# Patient Record
Sex: Female | Born: 1975 | Race: Black or African American | Hispanic: No | Smoking: Never smoker
Health system: Southern US, Community
[De-identification: ages and names within clinical notes are randomized; demographics above are authoritative.]

---

## 2016-09-27 ENCOUNTER — Encounter: Payer: Self-pay | Admitting: Emergency Medicine

## 2016-09-27 ENCOUNTER — Emergency Department
Admission: EM | Admit: 2016-09-27 | Discharge: 2016-09-27 | Disposition: A | Discharge status: Home / Self Care | Payer: Managed Care, Other (non HMO) | Attending: Emergency Medicine | Admitting: Emergency Medicine

## 2016-09-27 DIAGNOSIS — L258 Unspecified contact dermatitis due to other agents: Secondary | ICD-10-CM | POA: Insufficient documentation

## 2016-09-27 DIAGNOSIS — R21 Rash and other nonspecific skin eruption: Secondary | ICD-10-CM | POA: Diagnosis present

## 2016-09-27 MED ORDER — PREDNISONE 20 MG PO TABS
40.0000 mg | ORAL_TABLET | Freq: Once | ORAL | Status: AC
  Administered 2016-09-27: 40 mg via ORAL
  Filled 2016-09-27: qty 2

## 2016-09-27 MED ORDER — PREDNISONE 20 MG PO TABS: ORAL_TABLET | ORAL | 0 refills | Status: DC

## 2016-09-27 NOTE — ED Notes (Signed)
Upon assessment pt reports having "itchy skin" that began yesterday. Upon assessment pt's skin looks drys and mild irritation visible to nose.

## 2016-09-27 NOTE — ED Triage Notes (Signed)
Pt ambulatory to triage with steady gait, no distress noted. Pt c/o rash on mouth and nose x1 day. Pt took benadryl on 09/26/16 without relief.

## 2016-09-27 NOTE — ED Provider Notes (Signed)
[facility] Emergency Department Provider Note   ____________________________________________   First MD Initiated Contact with Patient 09/27/16 [phone]     (approximate)  I have reviewed the triage vital signs and the nursing notes.   HISTORY  Chief Complaint Allergic Reaction    HPI Tanay Massiah is a 41 y.o. female who presents to the ED from home with a chief complaint of rash. Patient states she used a friend's lip gloss and developed a rash on her lip and nose yesterday. Feels itchy all over. Denies associated tongue or lip swelling, shortness of breath, wheezing, abdominal pain, nausea, vomiting. Took Benadryl yesterday without relief of symptoms.   History reviewed. No pertinent past medical history.  There are no active problems to display for this patient.   History reviewed. No pertinent surgical history.  Prior to Admission medications   Medication Sig Start Date End Date Taking? Authorizing Provider  predniSONE (DELTASONE) 20 MG tablet 2 tablets daily x 4 days 09/27/16   Irean Hong, MD    Allergies Patient has no known allergies.  History reviewed. No pertinent family history.  Social History Social History  Substance Use Topics  . Smoking status: Never Smoker  . Smokeless tobacco: Never Used  . Alcohol use No    Review of Systems  Constitutional: No fever/chills. Eyes: No visual changes. ENT: No sore throat. Cardiovascular: Denies chest pain. Respiratory: Denies shortness of breath. Gastrointestinal: No abdominal pain.  No nausea, no vomiting.  No diarrhea.  No constipation. Genitourinary: Negative for dysuria. Musculoskeletal: Negative for back pain. Skin: Positive for rash. Neurological: Negative for headaches, focal weakness or numbness.   ____________________________________________   PHYSICAL EXAM:  VITAL SIGNS: ED Triage Vitals  Enc Vitals Group     BP 09/27/16 0227 137/81     Pulse Rate 09/27/16  0222 78     Resp 09/27/16 0222 15     Temp 09/27/16 0222 98.3 F (36.8 C)     Temp Source 09/27/16 0222 Oral     SpO2 09/27/16 0222 100 %     Weight 09/27/16 0222 180 lb (81.6 kg)     Height --      Head Circumference --      Peak Flow --      Pain Score --      Pain Loc --      Pain Edu? --      Excl. in GC? --     Constitutional: Alert and oriented. Well appearing and in no acute distress. Eyes: Conjunctivae are normal. PERRL. EOMI. Head: Atraumatic. Nose: Nose with mild irritation. No rash. No vesicles. Mouth/Throat: Upper lip with raised rash. No vesicles noted. No lip or tongue angioedema. No hoarse voice. Mucous membranes are moist.  Oropharynx non-erythematous. Neck: No stridor.   Cardiovascular: Normal rate, regular rhythm. Grossly normal heart sounds.  Good peripheral circulation. Respiratory: Normal respiratory effort.  No retractions. Lungs CTAB. Gastrointestinal: Soft and nontender. No distention. No abdominal bruits. No CVA tenderness. Musculoskeletal: No lower extremity tenderness nor edema.  No joint effusions. Neurologic:  Normal speech and language. No gross focal neurologic deficits are appreciated. No gait instability. Skin:  Skin is warm, dry and intact. No rash noted. No hives. No petechiae. Psychiatric: Mood and affect are normal. Speech and behavior are normal.  ____________________________________________   LABS (all labs ordered are listed, but only abnormal results are displayed)  Labs Reviewed - No data to display ____________________________________________  EKG  None ____________________________________________  RADIOLOGY  None ____________________________________________   PROCEDURES  Procedure(s) performed: None  Procedures  Critical Care performed: No  ____________________________________________   INITIAL IMPRESSION / ASSESSMENT AND PLAN / ED COURSE  Pertinent labs & imaging results that were available during my care of the  patient were reviewed by me and considered in my medical decision making (see chart for details).  41 year old female who presents with rash on her upper lip after using friend's lip balm. Clinical appearance of rash is not consistent with vesicles. Will treat patient with low-dose prednisone for contact dermatitis. Benadryl as needed for itching. Strict return precautions given. Patient verbalizes understanding and agrees with plan of care.      ____________________________________________   FINAL CLINICAL IMPRESSION(S) / ED DIAGNOSES  Final diagnoses:  Contact dermatitis due to other agent, unspecified contact dermatitis type      NEW MEDICATIONS STARTED DURING THIS VISIT:  New Prescriptions   PREDNISONE (DELTASONE) 20 MG TABLET    2 tablets daily x 4 days     Note:  This document was prepared using Dragon voice recognition software and may include unintentional dictation errors.    Irean Hong, MD 09/27/16 [phone]

## 2016-09-27 NOTE — Discharge Instructions (Signed)
1. Take prednisone 40 mg daily 4 days. Start your next dose on 4/25. 2. You may continue Benadryl as needed for itching. 3. Return to the ER for worsening symptoms, persistent vomiting, lip swelling, difficulty breathing or other concerns.

## 2017-03-17 ENCOUNTER — Emergency Department: Payer: Managed Care, Other (non HMO)

## 2017-03-17 ENCOUNTER — Encounter: Payer: Self-pay | Admitting: Emergency Medicine

## 2017-03-17 ENCOUNTER — Emergency Department
Admission: EM | Admit: 2017-03-17 | Discharge: 2017-03-17 | Disposition: A | Discharge status: Home / Self Care | Payer: Managed Care, Other (non HMO) | Attending: Emergency Medicine | Admitting: Emergency Medicine

## 2017-03-17 DIAGNOSIS — M549 Dorsalgia, unspecified: Secondary | ICD-10-CM | POA: Diagnosis present

## 2017-03-17 DIAGNOSIS — Z041 Encounter for examination and observation following transport accident: Secondary | ICD-10-CM | POA: Insufficient documentation

## 2017-03-17 MED ORDER — MELOXICAM 7.5 MG PO TABS: 7.5000 mg | ORAL_TABLET | Freq: Every day | ORAL | 1 refills | Status: AC

## 2017-03-17 MED ORDER — KETOROLAC TROMETHAMINE 30 MG/ML IJ SOLN
30.0000 mg | Freq: Once | INTRAMUSCULAR | Status: AC
  Administered 2017-03-17: 30 mg via INTRAMUSCULAR
  Filled 2017-03-17: qty 1

## 2017-03-17 MED ORDER — CYCLOBENZAPRINE HCL 5 MG PO TABS: 5.0000 mg | ORAL_TABLET | Freq: Three times a day (TID) | ORAL | 0 refills | Status: AC | PRN

## 2017-03-17 MED ORDER — CYCLOBENZAPRINE HCL 10 MG PO TABS
10.0000 mg | ORAL_TABLET | Freq: Once | ORAL | Status: AC
  Administered 2017-03-17: 10 mg via ORAL
  Filled 2017-03-17: qty 1
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
## 2017-04-20 ENCOUNTER — Encounter: Payer: Self-pay | Admitting: *Deleted

## 2017-04-20 ENCOUNTER — Other Ambulatory Visit: Payer: Self-pay

## 2017-04-20 ENCOUNTER — Emergency Department: Payer: No Typology Code available for payment source

## 2017-04-20 ENCOUNTER — Emergency Department
Admission: EM | Admit: 2017-04-20 | Discharge: 2017-04-20 | Disposition: A | Discharge status: Home / Self Care | Payer: No Typology Code available for payment source | Attending: Emergency Medicine | Admitting: Emergency Medicine

## 2017-04-20 DIAGNOSIS — M549 Dorsalgia, unspecified: Secondary | ICD-10-CM | POA: Insufficient documentation

## 2017-04-20 MED ORDER — KETOROLAC TROMETHAMINE 30 MG/ML IJ SOLN
30.0000 mg | Freq: Once | INTRAMUSCULAR | Status: AC
  Administered 2017-04-20: 30 mg via INTRAMUSCULAR
  Filled 2017-04-20: qty 1

## 2017-04-20 MED ORDER — KETOROLAC TROMETHAMINE 10 MG PO TABS: 10.0000 mg | ORAL_TABLET | Freq: Four times a day (QID) | ORAL | 0 refills | Status: AC | PRN

## 2017-04-20 MED ORDER — ORPHENADRINE CITRATE ER 100 MG PO TB12: 100.0000 mg | ORAL_TABLET | Freq: Two times a day (BID) | ORAL | 1 refills | Status: AC

## 2017-04-20 MED ORDER — ORPHENADRINE CITRATE 30 MG/ML IJ SOLN
60.0000 mg | Freq: Two times a day (BID) | INTRAMUSCULAR | Status: DC
  Administered 2017-04-20: 60 mg via INTRAMUSCULAR
  Filled 2017-04-20: qty 2
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
## 2018-07-04 ENCOUNTER — Emergency Department
Admission: EM | Admit: 2018-07-04 | Discharge: 2018-07-04 | Disposition: A | Discharge status: Home / Self Care | Payer: Self-pay | Attending: Emergency Medicine | Admitting: Emergency Medicine

## 2018-07-04 ENCOUNTER — Encounter: Payer: Self-pay | Admitting: Emergency Medicine

## 2018-07-04 DIAGNOSIS — J069 Acute upper respiratory infection, unspecified: Secondary | ICD-10-CM | POA: Insufficient documentation

## 2018-07-04 DIAGNOSIS — Z79899 Other long term (current) drug therapy: Secondary | ICD-10-CM | POA: Insufficient documentation

## 2018-07-04 DIAGNOSIS — B9789 Other viral agents as the cause of diseases classified elsewhere: Secondary | ICD-10-CM

## 2018-07-04 DIAGNOSIS — B9689 Other specified bacterial agents as the cause of diseases classified elsewhere: Secondary | ICD-10-CM | POA: Insufficient documentation

## 2018-07-04 MED ORDER — AZITHROMYCIN 250 MG PO TABS: ORAL_TABLET | ORAL | 0 refills | Status: DC

## 2018-07-04 MED ORDER — BENZONATATE 100 MG PO CAPS: ORAL_CAPSULE | ORAL | 0 refills | Status: DC

## 2018-07-04 NOTE — ED Provider Notes (Signed)
[facility] Emergency Department Provider Note ____________________________________________  Time seen: 1216  I have reviewed the triage vital signs and the nursing notes.  HISTORY  Chief Complaint  Cough; Nasal Congestion; Fever; Sore Throat; and Influenza  History as told to Juanda CrumbleMariah Teague, PA-S [facility]  HPI Jill Chung is a 43 y.o. female Presents herself to the ED for evaluation of 2 to 3 days of flulike symptoms.  Patient reports that 2 of her coworkers have also tested positive for flu.  She did not receive the seasonal flu vaccine.  She describes cough, congestion, ear pain, intermittent fevers, and sore throat.  She has been taken over-the-counter Mucinex, daily allergy medicines, and antipyretics.  Patient denies any nausea, vomiting, or diarrhea.  History reviewed. No pertinent past medical history.  There are no active problems to display for this patient.  History reviewed. No pertinent surgical history.  Prior to Admission medications   Medication Sig Start Date End Date Taking? Authorizing Provider  azithromycin (ZITHROMAX Z-PAK) 250 MG tablet Take 2 tablets (500 mg) on  Day 1,  followed by 1 tablet (250 mg) once daily on Days 2 through 5. 07/04/18   Tayvion Lauder, Charlesetta IvoryJenise V Bacon, PA-C  benzonatate (TESSALON PERLES) 100 MG capsule Take 1-2 tabs TID prn cough 07/04/18   Laelia Angelo, Charlesetta IvoryJenise V Bacon, PA-C  predniSONE (DELTASONE) 20 MG tablet 2 tablets daily x 4 days 09/27/16   Irean HongSung, Jade J, MD    Allergies Codeine and Penicillins  No family history on file.  Social History Social History   Tobacco Use  . Smoking status: Never Smoker  . Smokeless tobacco: Never Used  Substance Use Topics  . Alcohol use: Yes  . Drug use: No    Review of Systems  Constitutional: Positive for fever. Eyes: Negative for visual changes. ENT: Positive for sore throat. Cardiovascular: Negative for chest pain. Respiratory: Negative for shortness of  breath. Gastrointestinal: Negative for abdominal pain, vomiting and diarrhea. Genitourinary: Negative for dysuria. Musculoskeletal: Negative for back pain. Skin: Negative for rash. Neurological: Negative for headaches, focal weakness or numbness. ____________________________________________  PHYSICAL EXAM:  VITAL SIGNS: ED Triage Vitals  Enc Vitals Group     BP 07/04/18 1037 105/74     Pulse Rate 07/04/18 1036 81     Resp 07/04/18 1036 20     Temp 07/04/18 1036 98.4 F (36.9 C)     Temp Source 07/04/18 1036 Oral     SpO2 07/04/18 1036 99 %     Weight 07/04/18 1029 185 lb (83.9 kg)     Height 07/04/18 1029 5\' 3"  (1.6 m)     Head Circumference --      Peak Flow --      Pain Score 07/04/18 1029 1     Pain Loc --      Pain Edu? --      Excl. in GC? --     Constitutional: Alert and oriented. Well appearing and in no distress. Head: Normocephalic and atraumatic. Eyes: Conjunctivae are normal. PERRL. Normal extraocular movements Ears: Canals clear. TMs erythematous, injected, and intact bilaterally. Nose: No congestion/rhinorrhea/epistaxis. Mouth/Throat: Mucous membranes are moist. Neck: Supple. No thyromegaly. Hematological/Lymphatic/Immunological: No cervical lymphadenopathy. Cardiovascular: Normal rate, regular rhythm. Normal distal pulses. Respiratory: Normal respiratory effort. No wheezes/rales/rhonchi. Gastrointestinal: Soft and nontender. No distention. ____________________________________________  PROCEDURES  Procedures ___________________________________________  INITIAL IMPRESSION / ASSESSMENT AND PLAN / ED COURSE  Patient with ED evaluation of flulike symptoms for the last 3 days.  Patient clinical  picture may represent influenza versus another nonspecific viral etiology.  We have opted to not test for influenza at this time that she is beyond the treatment window for Tamiflu.  Patient will be treated empirically for her symptoms including Tessalon Perles and  azithromycin.  Should continue with over-the-counter medicines as prescribed.  She will follow-up with primary provider or return to the ED as needed. ____________________________________________  FINAL CLINICAL IMPRESSION(S) / ED DIAGNOSES  Final diagnoses:  Viral URI with cough      Karmen StabsMenshew, Charlesetta IvoryJenise V Bacon, PA-C 07/04/18 1836    Sharman CheekStafford, Phillip, MD 07/05/18 1524

## 2018-07-04 NOTE — ED Triage Notes (Signed)
Pt reports flu-likes sx's for 2 days, states has been exposed to the flu as well.

## 2018-07-04 NOTE — Discharge Instructions (Addendum)
Your symptoms are consistent with a viral infection, likely the flu. Take the prescription meds as directed. Follow-up with the pediatrician as needed.

## 2018-12-28 ENCOUNTER — Other Ambulatory Visit: Payer: Self-pay

## 2018-12-28 DIAGNOSIS — Z20822 Contact with and (suspected) exposure to covid-19: Secondary | ICD-10-CM

## 2018-12-31 LAB — NOVEL CORONAVIRUS, NAA: SARS-CoV-2, NAA: NOT DETECTED

## 2019-01-08 ENCOUNTER — Telehealth: Payer: Self-pay | Admitting: General Practice

## 2019-01-08 NOTE — Telephone Encounter (Signed)
Pt aware covid lab test negative, not detected °

## 2019-02-25 ENCOUNTER — Other Ambulatory Visit: Payer: Self-pay

## 2019-02-25 DIAGNOSIS — Z20822 Contact with and (suspected) exposure to covid-19: Secondary | ICD-10-CM

## 2019-02-27 LAB — NOVEL CORONAVIRUS, NAA: SARS-CoV-2, NAA: NOT DETECTED

## 2019-07-30 ENCOUNTER — Other Ambulatory Visit: Payer: Self-pay | Admitting: Family Medicine

## 2019-07-30 DIAGNOSIS — Z1231 Encounter for screening mammogram for malignant neoplasm of breast: Secondary | ICD-10-CM

## 2019-08-23 ENCOUNTER — Ambulatory Visit
Admission: RE | Admit: 2019-08-23 | Discharge: 2019-08-23 | Disposition: A | Discharge status: Home / Self Care | Payer: BC Managed Care – PPO | Source: Ambulatory Visit | Attending: Family Medicine | Admitting: Family Medicine

## 2019-08-23 DIAGNOSIS — Z1231 Encounter for screening mammogram for malignant neoplasm of breast: Secondary | ICD-10-CM | POA: Diagnosis not present

## 2019-08-28 ENCOUNTER — Other Ambulatory Visit: Payer: Self-pay | Admitting: Family Medicine

## 2019-08-28 DIAGNOSIS — R928 Other abnormal and inconclusive findings on diagnostic imaging of breast: Secondary | ICD-10-CM

## 2019-08-28 DIAGNOSIS — N6489 Other specified disorders of breast: Secondary | ICD-10-CM

## 2019-08-28 DIAGNOSIS — N6320 Unspecified lump in the left breast, unspecified quadrant: Secondary | ICD-10-CM

## 2019-09-06 ENCOUNTER — Ambulatory Visit
Admission: RE | Admit: 2019-09-06 | Discharge: 2019-09-06 | Disposition: A | Discharge status: Home / Self Care | Payer: BC Managed Care – PPO | Source: Ambulatory Visit | Attending: Family Medicine | Admitting: Family Medicine

## 2019-09-06 DIAGNOSIS — R928 Other abnormal and inconclusive findings on diagnostic imaging of breast: Secondary | ICD-10-CM | POA: Insufficient documentation

## 2019-09-06 DIAGNOSIS — N6320 Unspecified lump in the left breast, unspecified quadrant: Secondary | ICD-10-CM

## 2019-09-06 DIAGNOSIS — N6489 Other specified disorders of breast: Secondary | ICD-10-CM

## 2019-09-12 ENCOUNTER — Other Ambulatory Visit: Payer: Self-pay | Admitting: Family Medicine

## 2019-09-12 DIAGNOSIS — R928 Other abnormal and inconclusive findings on diagnostic imaging of breast: Secondary | ICD-10-CM

## 2019-09-12 DIAGNOSIS — N6320 Unspecified lump in the left breast, unspecified quadrant: Secondary | ICD-10-CM

## 2020-05-28 ENCOUNTER — Other Ambulatory Visit: Payer: Self-pay

## 2020-05-28 ENCOUNTER — Ambulatory Visit
Admission: RE | Admit: 2020-05-28 | Discharge: 2020-05-28 | Disposition: A | Discharge status: Home / Self Care | Payer: Managed Care, Other (non HMO) | Source: Ambulatory Visit | Attending: Family Medicine | Admitting: Family Medicine

## 2020-05-28 DIAGNOSIS — R928 Other abnormal and inconclusive findings on diagnostic imaging of breast: Secondary | ICD-10-CM | POA: Diagnosis present

## 2020-05-28 DIAGNOSIS — N6320 Unspecified lump in the left breast, unspecified quadrant: Secondary | ICD-10-CM

## 2020-09-25 IMAGING — MG DIGITAL DIAGNOSTIC BILAT W/ TOMO W/ CAD
8 of 14 series · 8 of 40 positions shown · non-contrast
Comparison: Previous exam(s).

CLINICAL DATA: Recall from screening to evaluate 2 possible left
breast masses and a possible right breast asymmetry.

EXAM:
DIGITAL DIAGNOSTIC bilateral MAMMOGRAM WITH TOMO
ULTRASOUND left BREAST

[L MLO synth-2D]
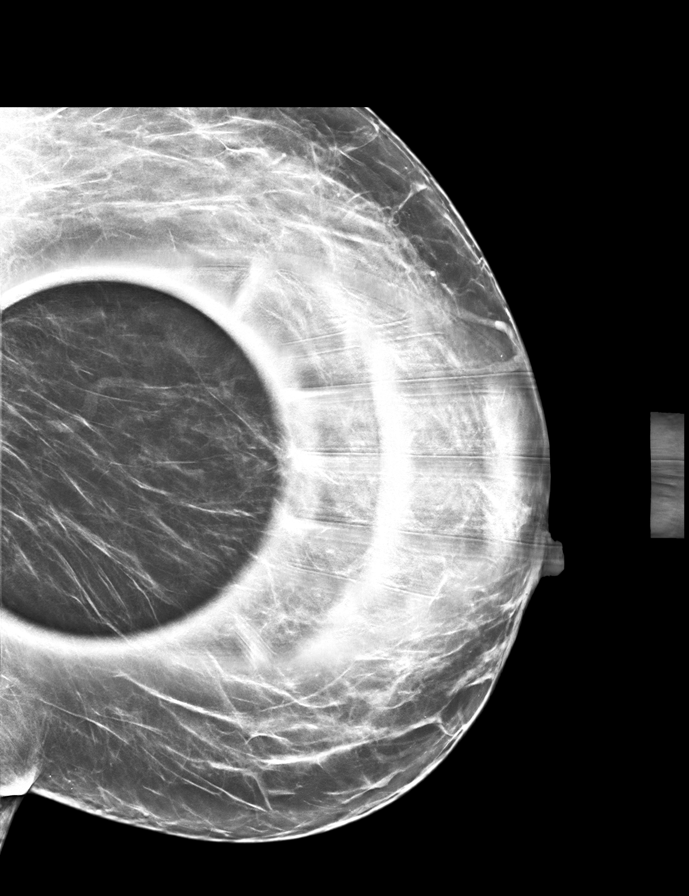

[R ML synth-2D]
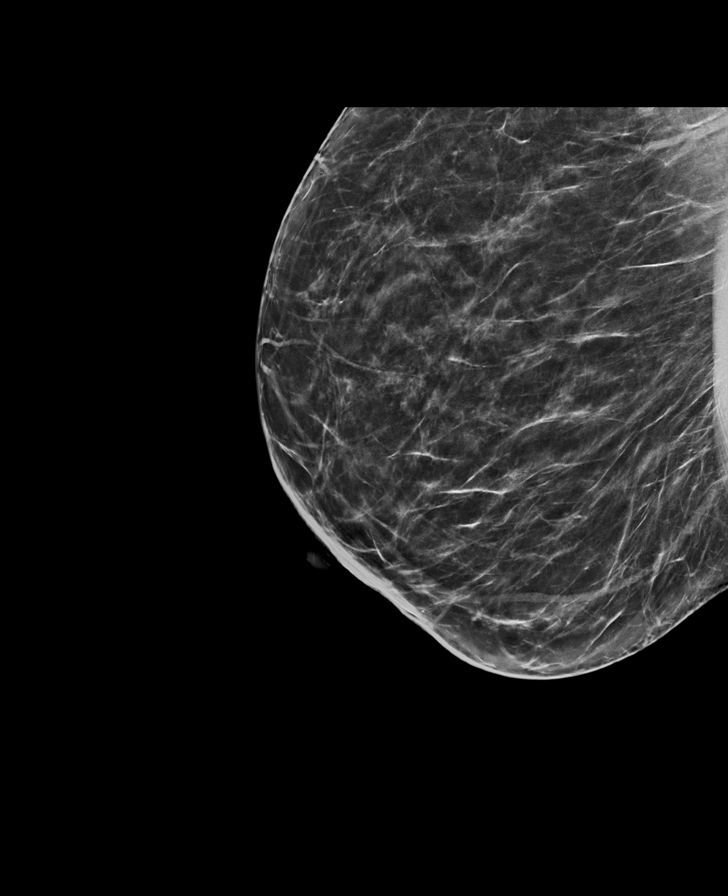

[L ML synth-2D]
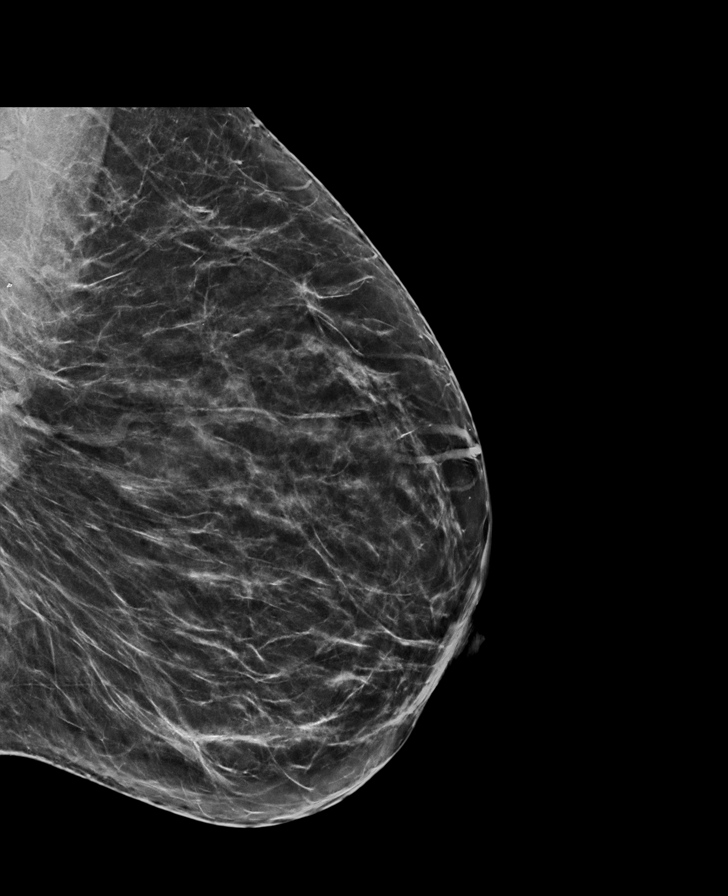

[R MLO synth-2D (1 of 2)]
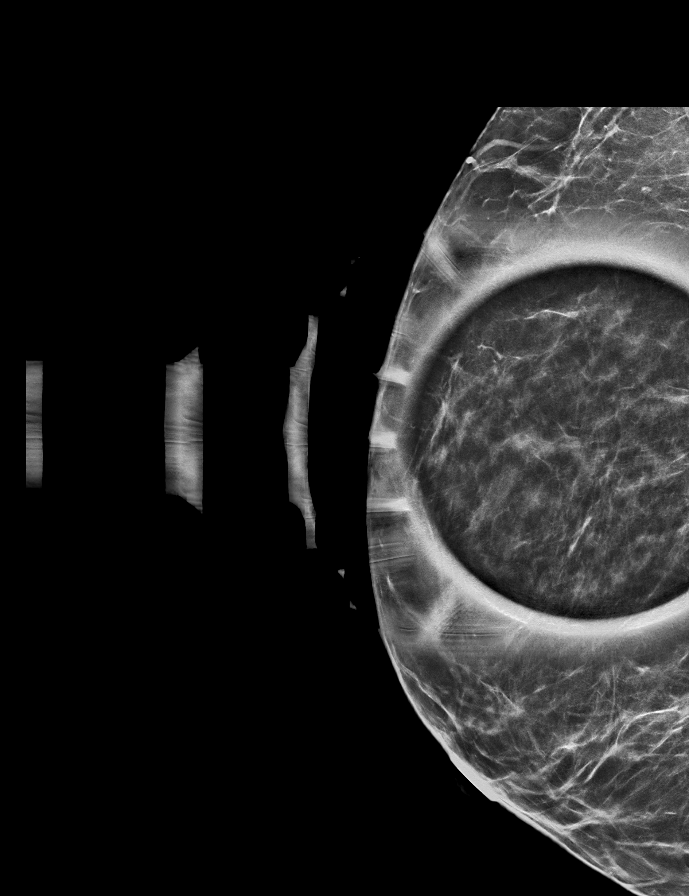

[L CC synth-2D (1 of 2)]
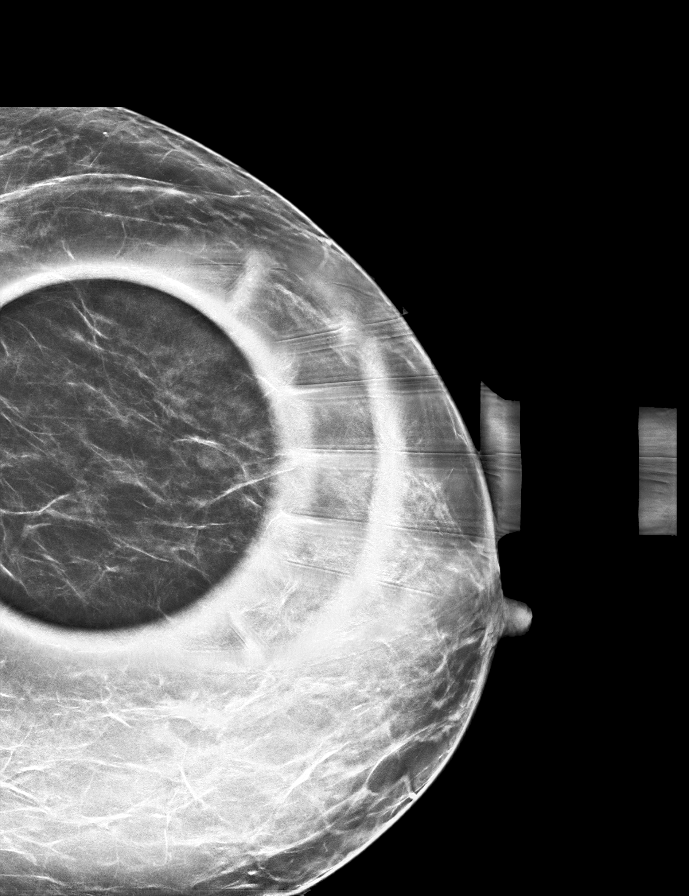

[R MLO synth-2D (2 of 2)]
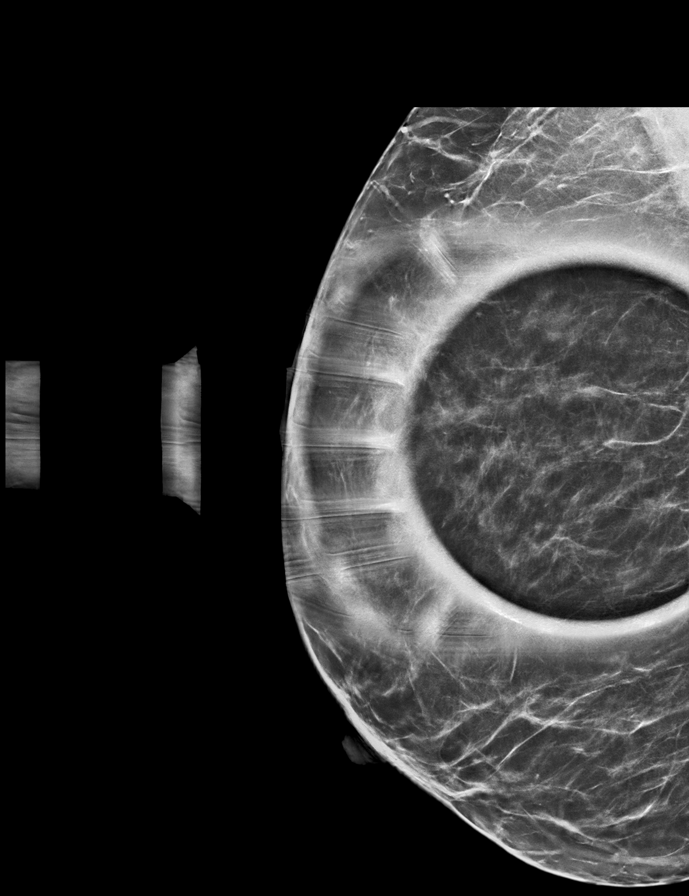

[L CC synth-2D (2 of 2)]
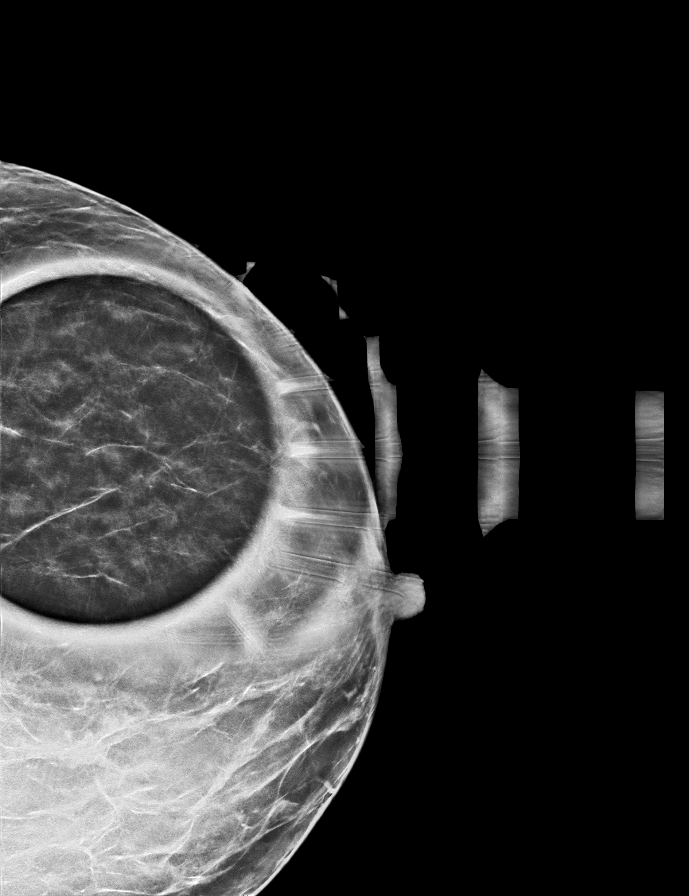

[R ML tomo · tomo slice 35/70.0]
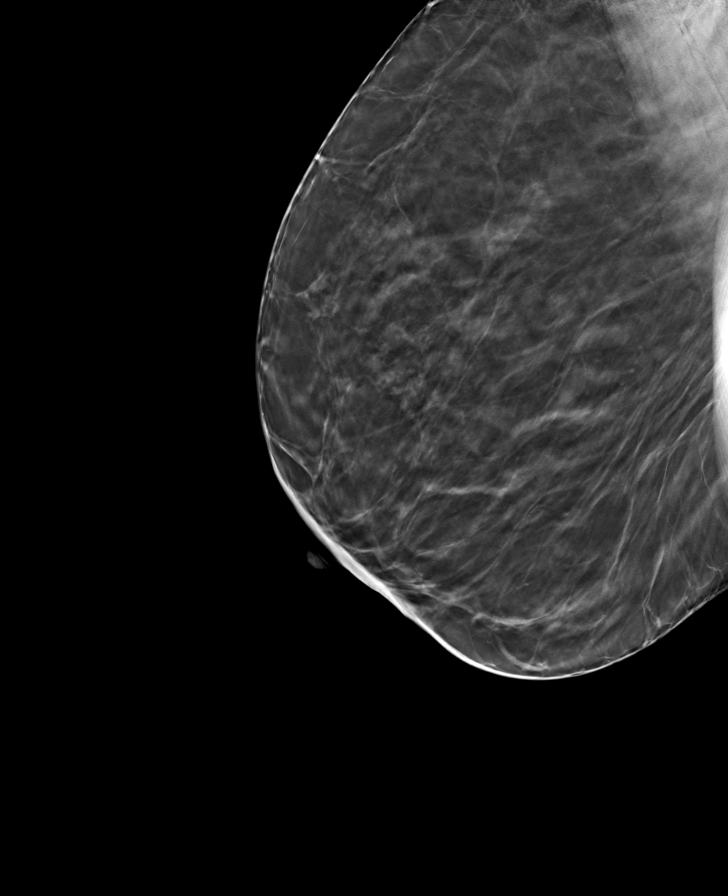

[8 of 40 positions shown; findings below may reference images not displayed]

ACR Breast Density Category b: There are scattered areas of
fibroglandular density.
FINDINGS: Images of the right breast demonstrate no focal abnormality over the
middle third of the upper right breast as the possible screening
asymmetry is compatible with overlapping fibroglandular tissue.

Multiple additional images of the left breast demonstrate
persistence of an oval circumscribed subcentimeter mass over the
middle third of the outer left breast which is likely within the mid
to upper breast.

Targeted ultrasound is performed, showing an oval circumscribed hypo
to anechoic mass at the 1 o'clock position of the left breast 6 cm
from the nipple measuring 3 x 8 x 9 mm likely corresponding to the
mammographic finding. There is an adjacent oval circumscribed
similar appearing mass also at the 1 o'clock position approximately
6-7 mm from the nipple measuring 3 x 7 x 7 mm. These likely
represent complicated cysts or foci of apocrine metaplasia.
IMPRESSION: Two probable benign subcentimeter masses as described over the 1
o'clock position of the left breast.

RECOMMENDATION:
Recommend six-month follow-up diagnostic left breast mammogram and
ultrasound to document stability of these probable benign masses.

I have discussed the findings and recommendations with the patient.
If applicable, a reminder letter will be sent to the patient
regarding the next appointment.

BI-RADS CATEGORY  3: Probably benign.

## 2020-10-08 ENCOUNTER — Encounter: Payer: Self-pay | Admitting: Certified Nurse Midwife

## 2020-10-08 ENCOUNTER — Other Ambulatory Visit: Payer: Self-pay

## 2020-10-08 ENCOUNTER — Ambulatory Visit (INDEPENDENT_AMBULATORY_CARE_PROVIDER_SITE_OTHER): Payer: Managed Care, Other (non HMO) | Admitting: Certified Nurse Midwife

## 2020-10-08 VITALS — BP 119/81 | HR 67 | Resp 16 | Ht 63.5 in | Wt 173.3 lb

## 2020-10-08 DIAGNOSIS — Z01419 Encounter for gynecological examination (general) (routine) without abnormal findings: Secondary | ICD-10-CM

## 2020-10-08 DIAGNOSIS — N6323 Unspecified lump in the left breast, lower outer quadrant: Secondary | ICD-10-CM | POA: Diagnosis not present

## 2020-10-08 DIAGNOSIS — N760 Acute vaginitis: Secondary | ICD-10-CM

## 2020-10-08 DIAGNOSIS — Z1231 Encounter for screening mammogram for malignant neoplasm of breast: Secondary | ICD-10-CM | POA: Diagnosis not present

## 2020-10-08 DIAGNOSIS — Z975 Presence of (intrauterine) contraceptive device: Secondary | ICD-10-CM

## 2020-10-08 DIAGNOSIS — Z7689 Persons encountering health services in other specified circumstances: Secondary | ICD-10-CM

## 2020-10-08 MED ORDER — METRONIDAZOLE 0.75 % VA GEL: 1.0000 | Freq: Every day | VAGINAL | 5 refills | Status: DC

## 2020-10-08 NOTE — Progress Notes (Signed)
ANNUAL PREVENTATIVE CARE GYN  ENCOUNTER NOTE  Subjective:       Calleen Alvis is a 45 y.o. [phone] female here for a routine annual gynecologic exam. Here to establish complaints, no questions or concerns.   Denies difficulty breathing or respiratory distress, chest pain, abdominal pain, excessive vaginal bleeding, dysuria, and leg pain or swelling.    Gynecologic History  Patient's last menstrual period was 09/16/2020 (exact date).  Contraception: IUD, Paragard-inserted 10 years ago  Last Pap: 03/2020. Results were: normal  Obstetric History  OB History  Gravida Para Term Preterm AB Living  6 4 4     4   SAB IAB Ectopic Multiple Live Births               # Outcome Date GA Lbr Len/2nd Weight Sex Delivery Anes PTL Lv  6 Gravida           5 Gravida           4 Term           3 Term           2 Term           1 Term            Current Outpatient Medications on File Prior to Visit  Medication Sig Dispense Refill  . azithromycin (ZITHROMAX Z-PAK) 250 MG tablet Take 2 tablets (500 mg) on  Day 1,  followed by 1 tablet (250 mg) once daily on Days 2 through 5. 6 each 0  . benzonatate (TESSALON PERLES) 100 MG capsule Take 1-2 tabs TID prn cough 30 capsule 0  . predniSONE (DELTASONE) 20 MG tablet 2 tablets daily x 4 days 8 tablet 0   No current facility-administered medications on file prior to visit.    Allergies  Allergen Reactions  . Codeine   . Penicillins     Social History   Socioeconomic History  . Marital status: Married    Spouse name: Not on file  . Number of children: Not on file  . Years of education: Not on file  . Highest education level: Not on file  Occupational History  . Not on file  Tobacco Use  . Smoking status: Never Smoker  . Smokeless tobacco: Never Used  Vaping Use  . Vaping Use: Never used  Substance and Sexual Activity  . Alcohol use: Yes  . Drug use: No  . Sexual activity: Not on file  Other Topics Concern  . Not on file   Social History Narrative  . Not on file   Social Determinants of Health   Financial Resource Strain: Not on file  Food Insecurity: Not on file  Transportation Needs: Not on file  Physical Activity: Not on file  Stress: Not on file  Social Connections: Not on file  Intimate Partner Violence: Not on file    Family History  Problem Relation Age of Onset  . Breast cancer Neg Hx     The following portions of the patient's history were reviewed and updated as appropriate: allergies, current medications, past family history, past medical history, past social history, past surgical history and problem list.  Review of Systems  ROS negative except as noted above. Information obtained from patient.    Objective:   BP 119/81   Pulse 67   Resp 16   Ht 5' 3.5" (1.613 m)   Wt 173 lb 4.8 oz (78.6 kg)   BMI 30.22 kg/m    CONSTITUTIONAL: Well-developed,  well-nourished female in no acute distress.   PSYCHIATRIC: Normal mood and affect. Normal behavior. Normal judgment and thought content.  NEUROLGIC: Alert and oriented to person, place, and time. Normal muscle tone coordination. No cranial nerve deficit noted.  HENT:  Normocephalic, atraumatic.   EYES: Conjunctivae and EOM are normal.   NECK: Normal range of motion, supple, no masses.  Normal thyroid.   SKIN: Skin is warm and dry. No rash noted. Not diaphoretic. No erythema. No pallor. Professional tattoos present.   CARDIOVASCULAR: Normal heart rate noted, regular rhythm, no murmur.  RESPIRATORY: Clear to auscultation bilaterally. Effort and breath sounds normal, no problems with  respiration noted.  BREASTS: Symmetric in size. No masses, skin changes, nipple drainage, or lymphadenopathy except for known left breast lump at five (5) o'clock.   ABDOMEN: Soft, normal bowel sounds, no distention noted.  No tenderness, rebound or guarding. Surgical scar present.   PELVIC:  External Genitalia: Normal  Vagina: Normal  Cervix:  Normal, IUD string present  Uterus: Normal  Adnexa: Normal  MUSCULOSKELETAL: Normal range of motion. No tenderness.  No cyanosis, clubbing, or edema.  2+ distal pulses.  LYMPHATIC: No Axillary, Supraclavicular, or Inguinal Adenopathy.  Assessment:   Annual gynecologic examination 45 y.o.   Contraception: IUD, Paragard   Obesity 1   Problem List Items Addressed This Visit      Genitourinary   Recurrent vaginitis     Other   Breast lump on left side at 5 o'clock position   Relevant Orders   MM DIAG BREAST TOMO BILATERAL   US BREAST LTD UNI LEFT INC AXILLA   IUD (intrauterine device) in place    Other Visit Diagnoses    Well woman exam    -  Primary   Relevant Orders   MM DIAG BREAST TOMO BILATERAL   US BREAST LTD UNI LEFT INC AXILLA   Encounter for screening mammogram for malignant neoplasm of breast       Relevant Orders   MM DIAG BREAST TOMO BILATERAL   US BREAST LTD UNI LEFT INC AXILLA   Encounter to establish care          Plan:   Pap: Not needed   Mammogram: Ordered  Labs: Declined   Routine preventative health maintenance measures emphasized: Exercise/Diet/Weight control, Tobacco Warnings, Alcohol/Substance use risks and Stress Management; see AVS  Reviewed red flag symptoms and when to call  RTC for IUD removal and reinsertion when desired  Return to Clinic - 1 Year for [employer] [facility] [employer] or sooner if needed   Serafina Royals, CNM  [facility], [facility] 10/08/20 11:32 AM

## 2020-10-08 NOTE — Patient Instructions (Addendum)
Intrauterine Device Information An intrauterine device (IUD) is a medical device that is inserted into the uterus to prevent pregnancy. It is a small, T-shaped device that has one or two nylon strings hanging down from it. The strings hang out of the lower part of the uterus (cervix) to allow for future IUD removal. There are two types of IUDs:  Hormone IUD. This type of IUD is made of plastic and contains the hormone progestin (synthetic progesterone). A hormone IUD may last 3-5 years.  Copper IUD. This type of IUD has copper wire wrapped around it. A copper IUD may last up to 10 years. How is an IUD inserted? An IUD is inserted through the vagina, through the cervix, and into the uterus with a minor medical procedure. The procedure for IUD insertion may vary among health care providers and hospitals. How does an IUD work? Synthetic progesterone in a hormonal IUD prevents pregnancy by:  Thickening cervical mucus to prevent sperm from entering the uterus.  Thinning the uterine lining to prevent a fertilized egg from being implanted there. Copper in a copper IUD prevents pregnancy by making the uterus and fallopian tubes produce a fluid that kills sperm. What are the advantages of an IUD? Advantages of either type of IUD An IUD:  Is highly effective in preventing pregnancy.  Is reversible. You can become pregnant shortly after the IUD is removed.  Is low-maintenance and can stay in place for a long time.  Has no estrogen-related side effects.  Can be used when breastfeeding.  Is not associated with weight gain.  Can be inserted right after childbirth, an abortion, or a miscarriage. Advantages of a hormone IUD  If it is inserted within 7 days of your period starting, it works right after it has been inserted. If the hormone IUD is inserted at any other time in your cycle, you will need to use a backup method of birth control for 7 days after insertion.  It can make menstrual periods  lighter or stop completely.  It can reduce menstrual cramping and other discomforts from menstrual periods.  It can be used for 3-5 years, depending on which IUD you have. Advantages of a copper IUD  It works right after it is inserted.  It can be used as a form of emergency birth control if it is inserted within 5 days after having unprotected sex.  It does not interfere with your body's natural hormones.  It can be used for up to 10 years. What are the disadvantages of an IUD?  An IUD may cause irregular menstrual bleeding for a period of time after insertion.  It is common to have pain during insertion and have cramping and vaginal bleeding after insertion.  An IUD may cut the uterus (uterine perforation) when it is inserted. This is rare.  Pelvic inflammatory disease (PID) may happen after insertion of an IUD. PID is an infection in the uterus and fallopian tubes. The IUD does not cause the infection. The infection is usually from an unknown sexually transmitted infection (STI). This is rare, and it usually happens during the first 20 days after the IUD is inserted.  A copper IUD can make your menstrual flow heavier and more painful.  IUDs cannot prevent sexually transmitted infections (STIs). How is an IUD removed?   You will lie on your back with your knees bent and your feet in footrests (stirrups).  A device will be inserted into your vagina to spread apart the vaginal walls (  speculum). This will allow your health care provider to see the strings attached to the IUD.  Your health care provider will use a small instrument (forceps) to grasp the IUD strings and will pull firmly until the IUD is removed. You may have some discomfort when the IUD is removed. Your health care provider may recommend taking over-the-counter pain relievers, such as ibuprofen, before the procedure. You may also have minor spotting for a few days after the procedure. The procedure for IUD removal may  vary among health care providers and hospitals. Is an IUD right for me? If you are interested in an IUD, discuss it with your health care provider. He or she will make sure you are a good candidate for an IUD and will let you know more about the advantages, disadvantage, and possible side effects. This will allow you to make a decision about the device. Summary  An intrauterine device (IUD) is a medical device that is inserted in the uterus to prevent pregnancy. It is a small, T-shaped device that has one or two nylon strings hanging down from it.  A hormone IUD contains the hormone progestin (synthetic progesterone). A copper IUD has copper wire wrapped around it.  Synthetic progesterone in a hormone IUD prevents pregnancy by thickening cervical mucus and thinning the walls of the uterus. Copper in a copper IUD prevents pregnancy by making the uterus and fallopian tubes produce a fluid that kills sperm.  A hormone IUD can be left in place for 3-5 years. A copper IUD can be left in place for up to 10 years.  An IUD is inserted and removed by a health care provider. You may feel some pain during insertion and removal. Your health care provider may recommend taking over-the-counter pain medicine, such as ibuprofen, before an IUD procedure. This information is not intended to replace advice given to you by your health care provider. Make sure you discuss any questions you have with your health care provider. Document Revised: 12/04/2019 Document Reviewed: 12/04/2019 Elsevier Patient Education  2021 Elsevier Inc.   Preventive Care 36-38 Years Old, Female Preventive care refers to lifestyle choices and visits with your health care provider that can promote health and wellness. This includes:  A yearly physical exam. This is also called an annual wellness visit.  Regular dental and eye exams.  Immunizations.  Screening for certain conditions.  Healthy lifestyle choices, such as: ? Eating a  healthy diet. ? Getting regular exercise. ? Not using drugs or products that contain nicotine and tobacco. ? Limiting alcohol use. What can I expect for my preventive care visit? Physical exam Your health care provider will check your:  Height and weight. These may be used to calculate your BMI (body mass index). BMI is a measurement that tells if you are at a healthy weight.  Heart rate and blood pressure.  Body temperature.  Skin for abnormal spots. Counseling Your health care provider may ask you questions about your:  Past medical problems.  Family's medical history.  Alcohol, tobacco, and drug use.  Emotional well-being.  Home life and relationship well-being.  Sexual activity.  Diet, exercise, and sleep habits.  Work and work Statistician.  Access to firearms.  Method of birth control.  Menstrual cycle.  Pregnancy history. What immunizations do I need? Vaccines are usually given at various ages, according to a schedule. Your health care provider will recommend vaccines for you based on your age, medical history, and lifestyle or other factors, such as  travel or where you work.   What tests do I need? Blood tests  Lipid and cholesterol levels. These may be checked every 5 years, or more often if you are over 36 years old.  Hepatitis C test.  Hepatitis B test. Screening  Lung cancer screening. You may have this screening every year starting at age 61 if you have a 30-pack-year history of smoking and currently smoke or have quit within the past 15 years.  Colorectal cancer screening. ? All adults should have this screening starting at age 17 and continuing until age 22. ? Your health care provider may recommend screening at age 73 if you are at increased risk. ? You will have tests every 1-10 years, depending on your results and the type of screening test.  Diabetes screening. ? This is done by checking your blood sugar (glucose) after you have not eaten  for a while (fasting). ? You may have this done every 1-3 years.  Mammogram. ? This may be done every 1-2 years. ? Talk with your health care provider about when you should start having regular mammograms. This may depend on whether you have a family history of breast cancer.  BRCA-related cancer screening. This may be done if you have a family history of breast, ovarian, tubal, or peritoneal cancers.  Pelvic exam and Pap test. ? This may be done every 3 years starting at age 52. ? Starting at age 20, this may be done every 5 years if you have a Pap test in combination with an HPV test. Other tests  STD (sexually transmitted disease) testing, if you are at risk.  Bone density scan. This is done to screen for osteoporosis. You may have this scan if you are at high risk for osteoporosis. Talk with your health care provider about your test results, treatment options, and if necessary, the need for more tests. Follow these instructions at home: Eating and drinking  Eat a diet that includes fresh fruits and vegetables, whole grains, lean protein, and low-fat dairy products.  Take vitamin and mineral supplements as recommended by your health care provider.  Do not drink alcohol if: ? Your health care provider tells you not to drink. ? You are pregnant, may be pregnant, or are planning to become pregnant.  If you drink alcohol: ? Limit how much you have to 0-1 drink a day. ? Be aware of how much alcohol is in your drink. In the [place] one drink equals one 12 oz bottle of beer (355 mL), one 5 oz glass of wine (148 mL), or one 1 oz glass of hard liquor (44 mL).   Lifestyle  Take daily care of your teeth and gums. Brush your teeth every morning and night with fluoride toothpaste. Floss one time each day.  Stay active. Exercise for at least 30 minutes 5 or more days each week.  Do not use any products that contain nicotine or tobacco, such as cigarettes, e-cigarettes, and chewing tobacco.  If you need help quitting, ask your health care provider.  Do not use drugs.  If you are sexually active, practice safe sex. Use a condom or other form of protection to prevent STIs (sexually transmitted infections).  If you do not wish to become pregnant, use a form of birth control. If you plan to become pregnant, see your health care provider for a prepregnancy visit.  If told by your health care provider, take low-dose aspirin daily starting at age 47.  Find healthy ways  to cope with stress, such as: ? Meditation, yoga, or listening to music. ? Journaling. ? Talking to a trusted person. ? Spending time with friends and family. Safety  Always wear your seat belt while driving or riding in a vehicle.  Do not drive: ? If you have been drinking alcohol. Do not ride with someone who has been drinking. ? When you are tired or distracted. ? While texting.  Wear a helmet and other protective equipment during sports activities.  If you have firearms in your house, make sure you follow all gun safety procedures. What's next?  Visit your health care provider once a year for an annual wellness visit.  Ask your health care provider how often you should have your eyes and teeth checked.  Stay up to date on all vaccines. This information is not intended to replace advice given to you by your health care provider. Make sure you discuss any questions you have with your health care provider. Document Revised: 02/25/2020 Document Reviewed: 02/01/2018 Elsevier Patient Education  2021 [employer].

## 2020-11-16 ENCOUNTER — Telehealth: Payer: Self-pay | Admitting: Certified Nurse Midwife

## 2020-11-16 ENCOUNTER — Other Ambulatory Visit: Payer: Managed Care, Other (non HMO)

## 2020-11-16 DIAGNOSIS — R634 Abnormal weight loss: Secondary | ICD-10-CM

## 2020-11-16 NOTE — Telephone Encounter (Signed)
Pt states she is wanting to have labs done due to she is still continuing to lose weight for no reason.

## 2020-11-16 NOTE — Telephone Encounter (Signed)
May schedule labs appointment only: CBC, CMP, TSH panel, Vitamin D, and Lipid Panel. Thanks, JML

## 2020-11-17 ENCOUNTER — Other Ambulatory Visit: Payer: Self-pay

## 2020-11-17 ENCOUNTER — Other Ambulatory Visit: Payer: Managed Care, Other (non HMO)

## 2020-11-18 LAB — CBC WITH DIFFERENTIAL
Basophils Absolute: 0 10*3/uL (ref 0.0–0.2)
Basos: 1 %
EOS (ABSOLUTE): 0.3 10*3/uL (ref 0.0–0.4)
Eos: 5 %
Hematocrit: 39.7 % (ref 34.0–46.6)
Hemoglobin: 12.8 g/dL (ref 11.1–15.9)
Immature Grans (Abs): 0 10*3/uL (ref 0.0–0.1)
Immature Granulocytes: 0 %
Lymphocytes Absolute: 1.8 10*3/uL (ref 0.7–3.1)
Lymphs: 34 %
MCH: 29.6 pg (ref 26.6–33.0)
MCHC: 32.2 g/dL (ref 31.5–35.7)
MCV: 92 fL (ref 79–97)
Monocytes Absolute: 0.4 10*3/uL (ref 0.1–0.9)
Monocytes: 7 %
Neutrophils Absolute: 2.9 10*3/uL (ref 1.4–7.0)
Neutrophils: 53 %
RBC: 4.32 x10E6/uL (ref 3.77–5.28)
RDW: 12.3 % (ref 11.7–15.4)
WBC: 5.3 10*3/uL (ref 3.4–10.8)

## 2020-11-18 LAB — COMP. METABOLIC PANEL (12)
AST: 47 IU/L — ABNORMAL HIGH (ref 0–40)
Albumin/Globulin Ratio: 1.6 (ref 1.2–2.2)
Albumin: 4.4 g/dL (ref 3.8–4.8)
Alkaline Phosphatase: 75 IU/L (ref 44–121)
BUN/Creatinine Ratio: 16 (ref 9–23)
BUN: 11 mg/dL (ref 6–24)
Bilirubin Total: 0.3 mg/dL (ref 0.0–1.2)
Calcium: 9.2 mg/dL (ref 8.7–10.2)
Chloride: 102 mmol/L (ref 96–106)
Creatinine, Ser: 0.68 mg/dL (ref 0.57–1.00)
Globulin, Total: 2.7 g/dL (ref 1.5–4.5)
Glucose: 77 mg/dL (ref 65–99)
Potassium: 4.4 mmol/L (ref 3.5–5.2)
Sodium: 138 mmol/L (ref 134–144)
Total Protein: 7.1 g/dL (ref 6.0–8.5)
eGFR: 111 mL/min/{1.73_m2} (ref >59)

## 2020-11-18 LAB — LIPID PANEL
Chol/HDL Ratio: 2.2 ratio (ref 0.0–4.4)
Cholesterol, Total: 122 mg/dL (ref 100–199)
HDL: 56 mg/dL (ref >39)
LDL Chol Calc (NIH): 55 mg/dL (ref 0–99)
Triglycerides: 48 mg/dL (ref 0–149)
VLDL Cholesterol Cal: 11 mg/dL (ref 5–40)

## 2020-11-18 LAB — TSH: TSH: 0.972 u[IU]/mL (ref 0.450–4.500)

## 2020-11-18 LAB — VITAMIN D 25 HYDROXY (VIT D DEFICIENCY, FRACTURES): Vit D, 25-Hydroxy: 27.8 ng/mL — ABNORMAL LOW (ref 30.0–100.0)

## 2020-11-23 ENCOUNTER — Ambulatory Visit (INDEPENDENT_AMBULATORY_CARE_PROVIDER_SITE_OTHER): Payer: Managed Care, Other (non HMO) | Admitting: Certified Nurse Midwife

## 2020-11-23 ENCOUNTER — Other Ambulatory Visit: Payer: Self-pay

## 2020-11-23 ENCOUNTER — Other Ambulatory Visit: Payer: Self-pay | Admitting: Certified Nurse Midwife

## 2020-11-23 ENCOUNTER — Encounter: Payer: Self-pay | Admitting: Certified Nurse Midwife

## 2020-11-23 VITALS — BP 121/79 | HR 75 | Resp 16 | Ht 63.5 in | Wt 171.0 lb

## 2020-11-23 DIAGNOSIS — R61 Generalized hyperhidrosis: Secondary | ICD-10-CM

## 2020-11-23 DIAGNOSIS — R634 Abnormal weight loss: Secondary | ICD-10-CM | POA: Diagnosis not present

## 2020-11-23 NOTE — Progress Notes (Signed)
GYN ENCOUNTER NOTE  Subjective:       Jill Chung is a 45 y.o. [id number] female is here for gynecologic evaluation of the following issues:  1. Night sweats and unexplained weight loss since surgery in 2020  Denies difficulty breathing or respiratory distress, chest pain, abdominal pain, excessive vaginal bleeding, dysuria, and leg pain or swelling.    Gynecologic History  Patient's last menstrual period was 11/23/2020.  Contraception: IUD, Paragard-inserted 10 years ago  Last Pap: 03/2020. Results were: normal  Last mammogram: 05/2020. Results were: BI-RADS 3  Obstetric History  OB History  Gravida Para Term Preterm AB Living  6 4 4     4   SAB IAB Ectopic Multiple Live Births               # Outcome Date GA Lbr Len/2nd Weight Sex Delivery Anes PTL Lv  6 Gravida           5 Gravida           4 Term           3 Term           2 Term           1 Term             History reviewed. No pertinent past medical history.  History reviewed. No pertinent surgical history.  Current Outpatient Medications on File Prior to Visit  Medication Sig Dispense Refill   cefdinir (OMNICEF) 300 MG capsule Take 300 mg by mouth 2 (two) times daily.     No current facility-administered medications on file prior to visit.    Allergies  Allergen Reactions   Codeine    Penicillins     Social History   Socioeconomic History   Marital status: Married    Spouse name: Not on file   Number of children: Not on file   Years of education: Not on file   Highest education level: Not on file  Occupational History   Not on file  Tobacco Use   Smoking status: Never   Smokeless tobacco: Never  Vaping Use   Vaping Use: Never used  Substance and Sexual Activity   Alcohol use: Yes   Drug use: No   Sexual activity: Not on file  Other Topics Concern   Not on file  Social History Narrative   Not on file   Social Determinants of Health   Financial Resource Strain: Not on file   Food Insecurity: Not on file  Transportation Needs: Not on file  Physical Activity: Not on file  Stress: Not on file  Social Connections: Not on file  Intimate Partner Violence: Not on file    Family History  Problem Relation Age of Onset   Breast cancer Neg Hx     The following portions of the patient's history were reviewed and updated as appropriate: allergies, current medications, past family history, past medical history, past social history, past surgical history and problem list.  Review of Systems  ROS negative except as noted above. Information obtained from patient.   Objective:   BP 121/79   Pulse 75   Resp 16   Ht 5' 3.5" (1.613 m)   Wt 171 lb (77.6 kg)   LMP 11/23/2020   BMI 29.82 kg/m   CONSTITUTIONAL: Well-developed, well-nourished female in no acute distress.   NECK: Normal range of motion, supple, no masses.  Normal thyroid.    Assessment:   1. Night sweats  -  TSH + free T4 - FSH/LH - Progesterone - Estradiol - CBC   2. Unexplained weight loss  Plan:   Labs today, see orders.    Offered referral to GI, Medical Nutrition Therapy or Weight Management; patient wishes to wait until labs result.   Reviewed red flag symptoms and when to call.   RTC as needed.    Serafina Royals, CNM [facility], [facility] 11/23/20 5:51 PM

## 2020-11-23 NOTE — Patient Instructions (Signed)
Williams Textbook of Endocrinology (14th ed., pp. 574-641). [place], [place]: Elsevier.">  Perimenopause Perimenopause is the normal time of a woman's life when the levels of estrogen, the female hormone produced by the ovaries, begin to decrease. This leads to changes in menstrual periods before they stop completely (menopause). Perimenopause can begin 2-8 years before menopause. During perimenopause,the ovaries may or may not produce an egg and a woman can still become pregnant. What are the causes? This condition is caused by a natural change in hormone levels that happens asyou get older. What increases the risk? This condition is more likely to start at an earlier age if you have certain medical conditions or have undergone treatments, including: A tumor of the pituitary gland in the brain. A disease that affects the ovaries and hormone production. Certain cancer treatments, such as chemotherapy or hormone therapy, or radiation therapy on the pelvis. Heavy smoking and excessive alcohol use. Family history of early menopause. What are the signs or symptoms? Perimenopausal changes affect each woman differently. Symptoms of this condition may include: Hot flashes. Irregular menstrual periods. Night sweats. Changes in feelings about sex. This could be a decrease in sex drive or an increased discomfort around your sexuality. Vaginal dryness. Headaches. Mood swings. Depression. Problems sleeping (insomnia). Memory problems or trouble concentrating. Irritability. Tiredness. Weight gain. Anxiety. Trouble getting pregnant. How is this diagnosed? This condition is diagnosed based on your medical history, a physical exam, your age, your menstrual history, and your symptoms. Hormone tests may also bedone. How is this treated? In some cases, no treatment is needed. You and your health care provider should make a decision together about whether treatment is necessary. Treatment will be based  on your individual condition and preferences. Various treatments are available, such as: Menopausal hormone therapy (MHT). Medicines to treat specific symptoms. Acupuncture. Vitamin or herbal supplements. Before starting treatment, make sure to let your health care provider know if you have a personal or family history of: Heart disease. Breast cancer. Blood clots. Diabetes. Osteoporosis. Follow these instructions at home: Medicines Take over-the-counter and prescription medicines only as told by your health care provider. Take vitamin supplements only as told by your health care provider. Talk with your health care provider before starting any herbal supplements. Lifestyle  Do not use any products that contain nicotine or tobacco, such as cigarettes, e-cigarettes, and chewing tobacco. If you need help quitting, ask your health care provider. Get at least 30 minutes of physical activity on 5 or more days each week. Eat a balanced diet that includes fresh fruits and vegetables, whole grains, soybeans, eggs, lean meat, and low-fat dairy. Avoid alcoholic and caffeinated beverages, as well as spicy foods. This may help prevent hot flashes. Get 7-8 hours of sleep each night. Dress in layers that can be removed to help you manage hot flashes. Find ways to manage stress, such as deep breathing, meditation, or journaling.  General instructions  Keep track of your menstrual periods, including: When they occur. How heavy they are and how long they last. How much time passes between periods. Keep track of your symptoms, noting when they start, how often you have them, and how long they last. Use vaginal lubricants or moisturizers to help with vaginal dryness and improve comfort during sex. You can still become pregnant if you are having irregular periods. Make sure you use contraception during perimenopause if you do not want to get pregnant. Keep all follow-up visits. This is important. This  includes any group therapy   or counseling.  Contact a health care provider if: You have heavy vaginal bleeding or pass blood clots. Your period lasts more than 2 days longer than normal. Your periods are recurring sooner than 21 days. You bleed after having sex. You have pain during sex. Get help right away if you have: Chest pain, trouble breathing, or trouble talking. Severe depression. Pain when you urinate. Severe headaches. Vision problems. Summary Perimenopause is the time when a woman's body begins to move into menopause. This may happen naturally or as a result of other health problems or medical treatments. Perimenopause can begin 2-8 years before menopause, and it can last for several years. Perimenopausal symptoms can be managed through medicines, lifestyle changes, and complementary therapies such as acupuncture. This information is not intended to replace advice given to you by your health care provider. Make sure you discuss any questions you have with your healthcare provider. Document Revised: 11/07/2019 Document Reviewed: 11/07/2019 Elsevier Patient Education  2022 Elsevier Inc.  

## 2020-11-24 LAB — CBC
Hematocrit: 39.5 % (ref 34.0–46.6)
Hemoglobin: 13.6 g/dL (ref 11.1–15.9)
MCH: 30.4 pg (ref 26.6–33.0)
MCHC: 34.4 g/dL (ref 31.5–35.7)
MCV: 88 fL (ref 79–97)
Platelets: 328 10*3/uL (ref 150–450)
RBC: 4.47 x10E6/uL (ref 3.77–5.28)
RDW: 11.5 % — ABNORMAL LOW (ref 11.7–15.4)
WBC: 7.5 10*3/uL (ref 3.4–10.8)

## 2020-11-24 LAB — TSH+FREE T4
Free T4: 1.12 ng/dL (ref 0.82–1.77)
TSH: 1.9 u[IU]/mL (ref 0.450–4.500)

## 2020-11-24 LAB — PROGESTERONE: Progesterone: 16.3 ng/mL

## 2020-11-24 LAB — FSH/LH
FSH: 2.7 m[IU]/mL
LH: 9.1 m[IU]/mL

## 2020-11-24 LAB — ESTRADIOL: Estradiol: 150 pg/mL

## 2020-12-01 ENCOUNTER — Telehealth: Payer: Self-pay

## 2020-12-01 NOTE — Telephone Encounter (Signed)
Patient called with concerns about her normal lab results. Since her symptoms are not from Perimenopause, what is the cause. What should she do now. She is concerned about weight loss and night sweats. Does she need a referral to GI. States father has Lupus and worried that she might have it as well. Please advise.

## 2020-12-03 NOTE — Telephone Encounter (Signed)
Please place referral to PCP. Thanks, JML

## 2020-12-03 NOTE — Telephone Encounter (Signed)
Patient called. She will make an appointment with her PCP.

## 2021-02-16 ENCOUNTER — Encounter: Payer: Self-pay | Admitting: Certified Nurse Midwife

## 2021-03-25 ENCOUNTER — Telehealth: Payer: Self-pay | Admitting: Obstetrics and Gynecology

## 2021-03-25 ENCOUNTER — Telehealth: Payer: Self-pay | Admitting: Certified Nurse Midwife

## 2021-03-25 NOTE — Telephone Encounter (Signed)
ERROR

## 2021-03-25 NOTE — Telephone Encounter (Signed)
Previous Jill Chung patient.  Patient is needing another order for a breast exam sen to [facility].  Patient stated that she was told that the order [facility] sent can not be used.

## 2021-03-26 ENCOUNTER — Other Ambulatory Visit: Payer: Self-pay

## 2021-03-26 DIAGNOSIS — Z1231 Encounter for screening mammogram for malignant neoplasm of breast: Secondary | ICD-10-CM

## 2021-03-26 DIAGNOSIS — N6323 Unspecified lump in the left breast, lower outer quadrant: Secondary | ICD-10-CM

## 2021-03-26 NOTE — Telephone Encounter (Signed)
Pt called and is aware that her mammogram orders has be replaced until Dr. Valentino Saxon. Pt is aware that she can call [facility] and schedule her mammogram.

## 2021-05-25 ENCOUNTER — Telehealth: Payer: Self-pay | Admitting: Obstetrics and Gynecology

## 2021-05-25 DIAGNOSIS — N6323 Unspecified lump in the left breast, lower outer quadrant: Secondary | ICD-10-CM

## 2021-05-25 NOTE — Telephone Encounter (Signed)
Pt is calling in stating that she when to have a mammogram done and they stated that they have the incorrect orders in for her to have a mammogram done.  Pt stated they gave her the following: Bilateral diagnostic mammogram and L breast ultra order is what needs to be placed so that she is able to have it rescheduled.  Pt would like to have a call once it has been re-ordered.

## 2021-06-03 ENCOUNTER — Other Ambulatory Visit: Payer: Self-pay | Admitting: Obstetrics and Gynecology

## 2021-06-03 DIAGNOSIS — N6323 Unspecified lump in the left breast, lower outer quadrant: Secondary | ICD-10-CM

## 2021-07-13 ENCOUNTER — Ambulatory Visit
Admission: RE | Admit: 2021-07-13 | Discharge: 2021-07-13 | Disposition: A | Discharge status: Home / Self Care | Payer: Managed Care, Other (non HMO) | Source: Ambulatory Visit | Attending: Obstetrics and Gynecology | Admitting: Obstetrics and Gynecology

## 2021-07-13 ENCOUNTER — Ambulatory Visit: Payer: Managed Care, Other (non HMO)

## 2021-07-13 ENCOUNTER — Other Ambulatory Visit: Payer: Self-pay

## 2021-07-13 DIAGNOSIS — Z1231 Encounter for screening mammogram for malignant neoplasm of breast: Secondary | ICD-10-CM

## 2021-07-13 DIAGNOSIS — N6323 Unspecified lump in the left breast, lower outer quadrant: Secondary | ICD-10-CM

## 2022-09-26 ENCOUNTER — Other Ambulatory Visit: Payer: Self-pay | Admitting: Family Medicine

## 2022-09-26 DIAGNOSIS — Z1231 Encounter for screening mammogram for malignant neoplasm of breast: Secondary | ICD-10-CM

## 2022-09-29 ENCOUNTER — Ambulatory Visit
Admission: RE | Admit: 2022-09-29 | Discharge: 2022-09-29 | Disposition: A | Discharge status: Home / Self Care | Payer: Managed Care, Other (non HMO) | Source: Ambulatory Visit | Attending: Family Medicine | Admitting: Family Medicine

## 2022-09-29 DIAGNOSIS — Z1231 Encounter for screening mammogram for malignant neoplasm of breast: Secondary | ICD-10-CM

## 2023-11-22 ENCOUNTER — Emergency Department
Admission: EM | Admit: 2023-11-22 | Discharge: 2023-11-22 | Disposition: A | Discharge status: Home / Self Care | Payer: MEDICAID | Attending: Emergency Medicine | Admitting: Emergency Medicine

## 2023-11-22 ENCOUNTER — Emergency Department: Payer: MEDICAID

## 2023-11-22 ENCOUNTER — Encounter: Payer: Self-pay | Admitting: Emergency Medicine

## 2023-11-22 ENCOUNTER — Other Ambulatory Visit: Payer: Self-pay

## 2023-11-22 DIAGNOSIS — R0789 Other chest pain: Secondary | ICD-10-CM | POA: Diagnosis present

## 2023-11-22 DIAGNOSIS — G8929 Other chronic pain: Secondary | ICD-10-CM | POA: Diagnosis not present

## 2023-11-22 DIAGNOSIS — M546 Pain in thoracic spine: Secondary | ICD-10-CM | POA: Insufficient documentation

## 2023-11-22 LAB — CBC
HCT: 35.9 % — ABNORMAL LOW (ref 36.0–46.0)
Hemoglobin: 12.3 g/dL (ref 12.0–15.0)
MCH: 32 pg (ref 26.0–34.0)
MCHC: 34.3 g/dL (ref 30.0–36.0)
MCV: 93.5 fL (ref 80.0–100.0)
Platelets: 263 10*3/uL (ref 150–400)
RBC: 3.84 MIL/uL — ABNORMAL LOW (ref 3.87–5.11)
RDW: 12.9 % (ref 11.5–15.5)
WBC: 5.5 10*3/uL (ref 4.0–10.5)
nRBC: 0 % (ref 0.0–0.2)

## 2023-11-22 LAB — COMPREHENSIVE METABOLIC PANEL WITH GFR
ALT: 21 U/L (ref 0–44)
AST: 18 U/L (ref 15–41)
Albumin: 3.8 g/dL (ref 3.5–5.0)
Alkaline Phosphatase: 55 U/L (ref 38–126)
Anion gap: 4 — ABNORMAL LOW (ref 5–15)
BUN: 12 mg/dL (ref 6–20)
CO2: 27 mmol/L (ref 22–32)
Calcium: 8.8 mg/dL — ABNORMAL LOW (ref 8.9–10.3)
Chloride: 106 mmol/L (ref 98–111)
Creatinine, Ser: 0.61 mg/dL (ref 0.44–1.00)
GFR, Estimated: >60 mL/min (ref >60)
Glucose, Bld: 83 mg/dL (ref 70–99)
Potassium: 4.2 mmol/L (ref 3.5–5.1)
Sodium: 137 mmol/L (ref 135–145)
Total Bilirubin: 0.8 mg/dL (ref 0.0–1.2)
Total Protein: 6.8 g/dL (ref 6.5–8.1)

## 2023-11-22 LAB — URINALYSIS, ROUTINE W REFLEX MICROSCOPIC
Bilirubin Urine: NEGATIVE
Glucose, UA: NEGATIVE mg/dL
Hgb urine dipstick: NEGATIVE
Ketones, ur: NEGATIVE mg/dL
Leukocytes,Ua: NEGATIVE
Nitrite: NEGATIVE
Protein, ur: NEGATIVE mg/dL
Specific Gravity, Urine: 1.019 (ref 1.005–1.030)
pH: 7 (ref 5.0–8.0)

## 2023-11-22 LAB — LIPASE, BLOOD: Lipase: 33 U/L (ref 11–51)

## 2023-11-22 LAB — TROPONIN I (HIGH SENSITIVITY): Troponin I (High Sensitivity): <2 ng/L (ref <18)

## 2023-11-22 LAB — POC URINE PREG, ED: Preg Test, Ur: NEGATIVE

## 2023-11-22 MED ORDER — IOHEXOL 350 MG/ML SOLN
100.0000 mL | Freq: Once | INTRAVENOUS | Status: AC | PRN
  Administered 2023-11-22: 100 mL via INTRAVENOUS

## 2023-11-22 MED ORDER — KETOROLAC TROMETHAMINE 15 MG/ML IJ SOLN
15.0000 mg | Freq: Once | INTRAMUSCULAR | Status: AC
  Administered 2023-11-22: 15 mg via INTRAVENOUS
  Filled 2023-11-22: qty 1

## 2023-11-22 NOTE — ED Notes (Signed)
 Urine attempted on this Pt but Pt unable to provide sample.

## 2023-11-22 NOTE — Discharge Instructions (Signed)
 CT imaging of your chest and abdomen/pelvis showed no acute pathology or any life-threatening concerns.  Your laboratory workup is otherwise reassuring.  This may represent a musculoskeletal injury or nerve injury.  Please follow-up with your primary care provider for ongoing evaluation.

## 2023-11-22 NOTE — ED Triage Notes (Signed)
 Pt c/o left side pain that started under her left rib cage in November 2024 and now radiates around into her back. Pt has had US , x-rays and was scheduled for Endo today but was canceled. Pt to ED due to continued pain without relief. Pt denies medication prior to arrival.

## 2023-11-22 NOTE — ED Notes (Addendum)
 Pt tells this RN she had a tummy tuck 2-3 years ago. States that she got an infection from it weeks later, and there was gas noted in the area as well. She was doing Spurvata treatments last month no longer on, Pt also states she hasn't passed gas in over a week.

## 2023-11-22 NOTE — ED Notes (Signed)
 Pt to CT scan.

## 2023-11-22 NOTE — ED Provider Notes (Signed)
 [facility] Provider Note    Event Date/Time   First MD Initiated Contact with Patient 11/22/23 [phone]     (approximate)   History   Abdominal Pain and Back Pain   HPI Jill Chung is a 48 y.o. female presenting today for abdominal pain and chest pain.  Patient states since November 2024 she has had left upper abdominal pain and left rib pain.  She reports having ultrasounds and MRIs performed outpatient which did not show any obvious findings.  She was scheduled to have an endoscopy today but came here instead due to pain symptoms.  She notes pain directly on the lower left ribs as well as new back pain on her left flank.  She otherwise denies nausea, vomiting, diarrhea, dysuria, constipation, chest pain, shortness of breath, leg pain, leg swelling.     Physical Exam   Triage Vital Signs: ED Triage Vitals  Encounter Vitals Group     BP 11/22/23 0631 134/88     Girls Systolic BP Percentile --      Girls Diastolic BP Percentile --      Boys Systolic BP Percentile --      Boys Diastolic BP Percentile --      Pulse Rate 11/22/23 0631 66     Resp --      Temp 11/22/23 0631 97.9 F (36.6 C)     Temp Source 11/22/23 0631 Oral     SpO2 11/22/23 0631 100 %     Weight 11/22/23 0632 151 lb (68.5 kg)     Height 11/22/23 0632 5' 3.5 (1.613 m)     Head Circumference --      Peak Flow --      Pain Score 11/22/23 0632 7     Pain Loc --      Pain Education --      Exclude from Growth Chart --     Most recent vital signs: Vitals:   11/22/23 0700 11/22/23 1030  BP: 118/86 126/77  Pulse: 70 64  Resp: (!) 9 13  Temp:    SpO2: 100% 100%   Physical Exam: I have reviewed the vital signs and nursing notes. General: Awake, alert, no acute distress.  Nontoxic appearing. Head:  Atraumatic, normocephalic.   ENT:  EOM intact, PERRL. Oral mucosa is pink and moist with no lesions. Neck: Neck is supple with full range of motion, No meningeal  signs. Cardiovascular:  RRR, No murmurs. Peripheral pulses palpable and equal bilaterally. Chest wall: Tenderness to palpation on the lower ribs on the anterior left side Respiratory:  Symmetrical chest wall expansion.  No rhonchi, rales, or wheezes.  Good air movement throughout.  No use of accessory muscles.   Musculoskeletal:  No cyanosis or edema. Moving extremities with full ROM Abdomen:  Soft, nontender, nondistended.  Left-sided CVA tenderness Neuro:  GCS 15, moving all four extremities, interacting appropriately. Speech clear. Psych:  Calm, appropriate.   Skin:  Warm, dry, no rash.    ED Results / Procedures / Treatments   Labs (all labs ordered are listed, but only abnormal results are displayed) Labs Reviewed  COMPREHENSIVE METABOLIC PANEL WITH GFR - Abnormal; Notable for the following components:      Result Value   Calcium 8.8 (*)    Anion gap 4 (*)    All other components within normal limits  CBC - Abnormal; Notable for the following components:   RBC 3.84 (*)    HCT 35.9 (*)  All other components within normal limits  URINALYSIS, ROUTINE W REFLEX MICROSCOPIC - Abnormal; Notable for the following components:   Color, Urine YELLOW (*)    APPearance CLEAR (*)    All other components within normal limits  POC URINE PREG, ED - Normal  LIPASE, BLOOD  TROPONIN I (HIGH SENSITIVITY)     EKG    RADIOLOGY Independently interpreted CTA chest and CT abdomen/pelvis with no acute findings   PROCEDURES:  Critical Care performed: No  Procedures   MEDICATIONS ORDERED IN ED: Medications  ketorolac  (TORADOL ) 15 MG/ML injection 15 mg (has no administration in time range)  iohexol (OMNIPAQUE) 350 MG/ML injection 100 mL (100 mLs Intravenous Contrast Given 11/22/23 0859)     IMPRESSION / MDM / ASSESSMENT AND PLAN / ED COURSE  I reviewed the triage vital signs and the nursing notes.                              Differential diagnosis includes, but is not limited  to, rib fractures, musculoskeletal injury, pyelonephritis, nephrolithiasis, chronic pain syndrome, lower suspicion PE  Patient's presentation is most consistent with exacerbation of chronic illness.  Patient is a 48 year old female presenting today for chronic symptoms of left lower chest wall pain as well as left back pain.  On exam she has point tenderness over the lower left sided anterior ribs.  She also has left-sided CVA tenderness.  No tenderness elsewhere throughout the entire abdomen, chest, or back.  No other associated symptoms at all.  Symptoms seem most prominently to occur when she moves around.  Vital signs otherwise stable.  She was offered pain medicine but politely declined.  CBC, lipase, CMP all unremarkable.  Given chronicity of symptoms with no definitive diagnosis, we did discuss additional CT imaging and will proceed with CTA chest and CT abdomen/pelvis.  CTA chest and CT abdomen/pelvis showed no acute findings explaining patient's symptoms today.  May represent musculoskeletal injury or possible nerve injury.  Laboratory workup and UA otherwise negative.  Patient will be discharged at this time and follow-up with her PCP and given strict return precautions.  The patient is on the cardiac monitor to evaluate for evidence of arrhythmia and/or significant heart rate changes.     FINAL CLINICAL IMPRESSION(S) / ED DIAGNOSES   Final diagnoses:  Chronic left-sided thoracic back pain     Rx / DC Orders   ED Discharge Orders     None        Note:  This document was prepared using Dragon voice recognition software and may include unintentional dictation errors.   Kandee Orion, MD 11/22/23 1149
